# Patient Record
Sex: Female | Born: 1965 | Race: White | Hispanic: No | Marital: Married
Health system: Southern US, Community
[De-identification: ages and names within clinical notes are randomized; demographics above are authoritative.]

---

## 2010-03-07 ENCOUNTER — Encounter: Admission: RE | Admit: 2010-03-07 | Discharge: 2010-03-07 | Payer: Self-pay | Admitting: Surgery

## 2010-03-07 HISTORY — PX: BREAST BIOPSY: SHX20

## 2010-03-12 ENCOUNTER — Emergency Department (HOSPITAL_COMMUNITY): Admission: EM | Admit: 2010-03-12 | Discharge: 2010-03-12 | Payer: Self-pay | Admitting: Emergency Medicine

## 2011-02-11 LAB — CBC
Hemoglobin: 13.5 g/dL (ref 12.0–15.0)
MCHC: 34 g/dL (ref 30.0–36.0)
Platelets: 253 10*3/uL (ref 150–400)
RBC: 4.36 MIL/uL (ref 3.87–5.11)
RDW: 12.9 % (ref 11.5–15.5)

## 2011-02-11 LAB — DIFFERENTIAL
Lymphocytes Relative: 33 % (ref 12–46)
Lymphs Abs: 1.6 10*3/uL (ref 0.7–4.0)
Monocytes Relative: 5 % (ref 3–12)
Neutro Abs: 3 10*3/uL (ref 1.7–7.7)
Neutrophils Relative %: 60 % (ref 43–77)

## 2011-02-11 LAB — POCT PREGNANCY, URINE: Preg Test, Ur: NEGATIVE

## 2011-02-11 LAB — URINALYSIS, ROUTINE W REFLEX MICROSCOPIC
Nitrite: NEGATIVE
Protein, ur: NEGATIVE mg/dL
Urobilinogen, UA: 0.2 mg/dL (ref 0.0–1.0)

## 2011-02-11 LAB — BASIC METABOLIC PANEL
Calcium: 8.9 mg/dL (ref 8.4–10.5)
GFR calc Af Amer: >60 mL/min (ref >60)

## 2011-02-11 LAB — GC/CHLAMYDIA PROBE AMP, GENITAL
Chlamydia, DNA Probe: NEGATIVE
GC Probe Amp, Genital: NEGATIVE

## 2011-02-11 LAB — RPR: RPR Ser Ql: NONREACTIVE

## 2011-02-11 LAB — URINE MICROSCOPIC-ADD ON

## 2011-04-14 ENCOUNTER — Other Ambulatory Visit: Payer: Self-pay | Admitting: Obstetrics and Gynecology

## 2011-04-14 DIAGNOSIS — N6320 Unspecified lump in the left breast, unspecified quadrant: Secondary | ICD-10-CM

## 2011-04-14 DIAGNOSIS — R921 Mammographic calcification found on diagnostic imaging of breast: Secondary | ICD-10-CM

## 2011-04-17 ENCOUNTER — Other Ambulatory Visit: Payer: Self-pay | Admitting: Obstetrics and Gynecology

## 2011-04-17 ENCOUNTER — Ambulatory Visit
Admission: RE | Admit: 2011-04-17 | Discharge: 2011-04-17 | Disposition: A | Discharge status: Home / Self Care | Payer: 59 | Source: Ambulatory Visit | Attending: Obstetrics and Gynecology | Admitting: Obstetrics and Gynecology

## 2011-04-17 DIAGNOSIS — N63 Unspecified lump in unspecified breast: Secondary | ICD-10-CM

## 2011-04-17 DIAGNOSIS — N6320 Unspecified lump in the left breast, unspecified quadrant: Secondary | ICD-10-CM

## 2011-04-17 DIAGNOSIS — R921 Mammographic calcification found on diagnostic imaging of breast: Secondary | ICD-10-CM

## 2011-08-07 ENCOUNTER — Other Ambulatory Visit: Payer: Self-pay | Admitting: Obstetrics and Gynecology

## 2011-08-07 DIAGNOSIS — D249 Benign neoplasm of unspecified breast: Secondary | ICD-10-CM

## 2011-08-14 ENCOUNTER — Ambulatory Visit
Admission: RE | Admit: 2011-08-14 | Discharge: 2011-08-14 | Disposition: A | Discharge status: Home / Self Care | Payer: 59 | Source: Ambulatory Visit | Attending: Obstetrics and Gynecology | Admitting: Obstetrics and Gynecology

## 2011-08-14 ENCOUNTER — Other Ambulatory Visit: Payer: Self-pay | Admitting: Obstetrics and Gynecology

## 2011-08-14 ENCOUNTER — Ambulatory Visit: Admission: RE | Admit: 2011-08-14 | Payer: 59 | Source: Ambulatory Visit

## 2011-08-14 DIAGNOSIS — D249 Benign neoplasm of unspecified breast: Secondary | ICD-10-CM

## 2011-08-14 HISTORY — PX: BREAST BIOPSY: SHX20

## 2012-01-16 ENCOUNTER — Other Ambulatory Visit: Payer: Self-pay | Admitting: Obstetrics and Gynecology

## 2012-01-16 DIAGNOSIS — Z1231 Encounter for screening mammogram for malignant neoplasm of breast: Secondary | ICD-10-CM

## 2012-02-19 ENCOUNTER — Ambulatory Visit: Payer: 59

## 2012-02-20 ENCOUNTER — Ambulatory Visit: Payer: 59

## 2012-03-01 ENCOUNTER — Ambulatory Visit
Admission: RE | Admit: 2012-03-01 | Discharge: 2012-03-01 | Disposition: A | Discharge status: Home / Self Care | Payer: 59 | Source: Ambulatory Visit | Attending: Obstetrics and Gynecology | Admitting: Obstetrics and Gynecology

## 2012-03-01 DIAGNOSIS — Z1231 Encounter for screening mammogram for malignant neoplasm of breast: Secondary | ICD-10-CM

## 2012-03-03 ENCOUNTER — Other Ambulatory Visit: Payer: Self-pay | Admitting: Obstetrics and Gynecology

## 2012-03-03 DIAGNOSIS — R928 Other abnormal and inconclusive findings on diagnostic imaging of breast: Secondary | ICD-10-CM

## 2012-03-05 ENCOUNTER — Inpatient Hospital Stay: Admission: RE | Admit: 2012-03-05 | Payer: 59 | Source: Ambulatory Visit

## 2012-03-15 ENCOUNTER — Ambulatory Visit
Admission: RE | Admit: 2012-03-15 | Discharge: 2012-03-15 | Disposition: A | Discharge status: Home / Self Care | Payer: 59 | Source: Ambulatory Visit | Attending: Obstetrics and Gynecology | Admitting: Obstetrics and Gynecology

## 2012-03-15 DIAGNOSIS — R928 Other abnormal and inconclusive findings on diagnostic imaging of breast: Secondary | ICD-10-CM

## 2012-12-15 ENCOUNTER — Other Ambulatory Visit: Payer: Self-pay | Admitting: Obstetrics and Gynecology

## 2012-12-15 DIAGNOSIS — N6320 Unspecified lump in the left breast, unspecified quadrant: Secondary | ICD-10-CM

## 2013-02-08 ENCOUNTER — Other Ambulatory Visit: Payer: Self-pay

## 2013-02-08 DIAGNOSIS — Z1231 Encounter for screening mammogram for malignant neoplasm of breast: Secondary | ICD-10-CM

## 2013-02-14 ENCOUNTER — Other Ambulatory Visit: Payer: Self-pay

## 2013-02-14 ENCOUNTER — Ambulatory Visit
Admission: RE | Admit: 2013-02-14 | Discharge: 2013-02-14 | Disposition: A | Discharge status: Home / Self Care | Payer: BC Managed Care – PPO | Source: Ambulatory Visit

## 2013-02-14 ENCOUNTER — Ambulatory Visit
Admission: RE | Admit: 2013-02-14 | Discharge: 2013-02-14 | Disposition: A | Discharge status: Home / Self Care | Payer: BC Managed Care – PPO | Source: Ambulatory Visit | Attending: Obstetrics and Gynecology | Admitting: Obstetrics and Gynecology

## 2013-02-14 DIAGNOSIS — N6320 Unspecified lump in the left breast, unspecified quadrant: Secondary | ICD-10-CM

## 2013-02-14 DIAGNOSIS — Z1231 Encounter for screening mammogram for malignant neoplasm of breast: Secondary | ICD-10-CM

## 2013-03-17 ENCOUNTER — Ambulatory Visit: Payer: 59

## 2014-01-23 ENCOUNTER — Other Ambulatory Visit: Payer: Self-pay

## 2014-01-23 DIAGNOSIS — Z1231 Encounter for screening mammogram for malignant neoplasm of breast: Secondary | ICD-10-CM

## 2014-02-15 ENCOUNTER — Other Ambulatory Visit: Payer: Self-pay | Admitting: Obstetrics and Gynecology

## 2014-02-15 ENCOUNTER — Ambulatory Visit
Admission: RE | Admit: 2014-02-15 | Discharge: 2014-02-15 | Disposition: A | Discharge status: Home / Self Care | Payer: Self-pay | Source: Ambulatory Visit

## 2014-02-15 DIAGNOSIS — N63 Unspecified lump in unspecified breast: Secondary | ICD-10-CM

## 2014-02-15 DIAGNOSIS — Z1231 Encounter for screening mammogram for malignant neoplasm of breast: Secondary | ICD-10-CM

## 2014-03-03 ENCOUNTER — Ambulatory Visit
Admission: RE | Admit: 2014-03-03 | Discharge: 2014-03-03 | Disposition: A | Discharge status: Home / Self Care | Payer: BC Managed Care – PPO | Source: Ambulatory Visit | Attending: Obstetrics and Gynecology | Admitting: Obstetrics and Gynecology

## 2014-03-03 DIAGNOSIS — N63 Unspecified lump in unspecified breast: Secondary | ICD-10-CM

## 2015-02-23 ENCOUNTER — Other Ambulatory Visit: Payer: Self-pay

## 2015-02-23 DIAGNOSIS — Z1231 Encounter for screening mammogram for malignant neoplasm of breast: Secondary | ICD-10-CM

## 2015-03-05 ENCOUNTER — Ambulatory Visit: Payer: Self-pay

## 2015-03-15 ENCOUNTER — Ambulatory Visit
Admission: RE | Admit: 2015-03-15 | Discharge: 2015-03-15 | Disposition: A | Discharge status: Home / Self Care | Payer: No Typology Code available for payment source | Source: Ambulatory Visit

## 2015-03-15 DIAGNOSIS — Z1231 Encounter for screening mammogram for malignant neoplasm of breast: Secondary | ICD-10-CM

## 2016-01-07 ENCOUNTER — Other Ambulatory Visit: Payer: Self-pay

## 2016-01-07 DIAGNOSIS — Z1231 Encounter for screening mammogram for malignant neoplasm of breast: Secondary | ICD-10-CM

## 2016-03-17 ENCOUNTER — Ambulatory Visit
Admission: RE | Admit: 2016-03-17 | Discharge: 2016-03-17 | Disposition: A | Discharge status: Home / Self Care | Payer: No Typology Code available for payment source | Source: Ambulatory Visit

## 2016-03-17 DIAGNOSIS — Z1231 Encounter for screening mammogram for malignant neoplasm of breast: Secondary | ICD-10-CM

## 2017-04-08 ENCOUNTER — Other Ambulatory Visit: Payer: Self-pay | Admitting: Obstetrics and Gynecology

## 2017-04-08 DIAGNOSIS — Z1231 Encounter for screening mammogram for malignant neoplasm of breast: Secondary | ICD-10-CM

## 2017-04-16 ENCOUNTER — Ambulatory Visit: Payer: No Typology Code available for payment source

## 2017-04-30 ENCOUNTER — Ambulatory Visit
Admission: RE | Admit: 2017-04-30 | Discharge: 2017-04-30 | Disposition: A | Discharge status: Home / Self Care | Payer: No Typology Code available for payment source | Source: Ambulatory Visit | Attending: Obstetrics and Gynecology | Admitting: Obstetrics and Gynecology

## 2017-04-30 DIAGNOSIS — Z1231 Encounter for screening mammogram for malignant neoplasm of breast: Secondary | ICD-10-CM

## 2019-01-10 ENCOUNTER — Other Ambulatory Visit: Payer: Self-pay | Admitting: Obstetrics and Gynecology

## 2019-01-10 DIAGNOSIS — Z1231 Encounter for screening mammogram for malignant neoplasm of breast: Secondary | ICD-10-CM

## 2019-01-11 ENCOUNTER — Ambulatory Visit
Admission: RE | Admit: 2019-01-11 | Discharge: 2019-01-11 | Disposition: A | Discharge status: Home / Self Care | Payer: No Typology Code available for payment source | Source: Ambulatory Visit | Attending: Obstetrics and Gynecology | Admitting: Obstetrics and Gynecology

## 2019-01-11 DIAGNOSIS — Z1231 Encounter for screening mammogram for malignant neoplasm of breast: Secondary | ICD-10-CM

## 2020-03-19 IMAGING — MG DIGITAL SCREENING BILATERAL MAMMOGRAM WITH TOMO AND CAD
8 series · 8 of 24 positions shown · non-contrast
Comparison: Previous exam(s).

CLINICAL DATA: Screening.

EXAM:
DIGITAL SCREENING BILATERAL MAMMOGRAM WITH TOMO AND CAD

[L MLO synth-2D]
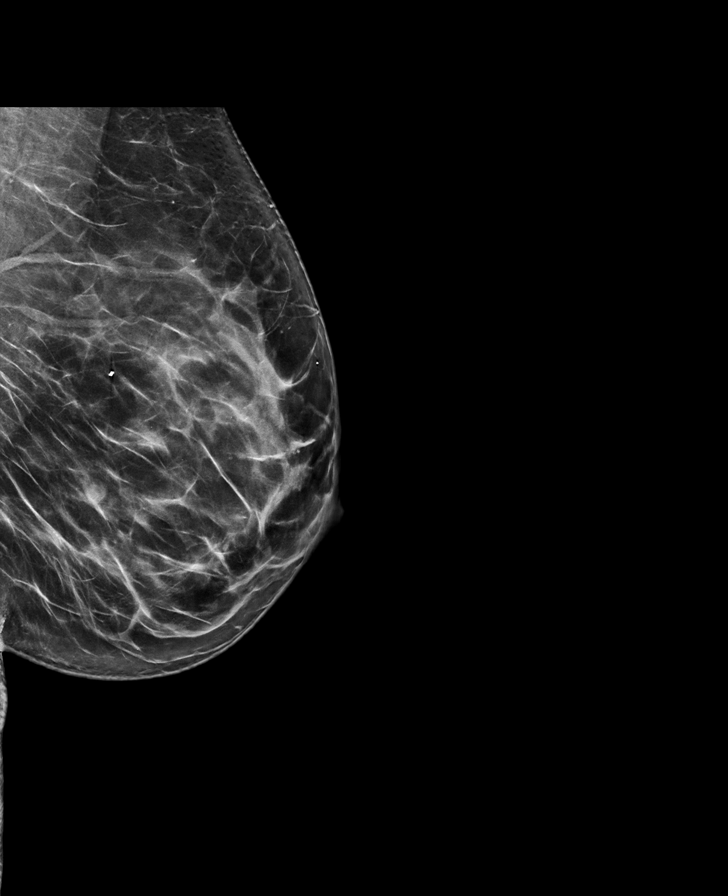

[R MLO synth-2D]
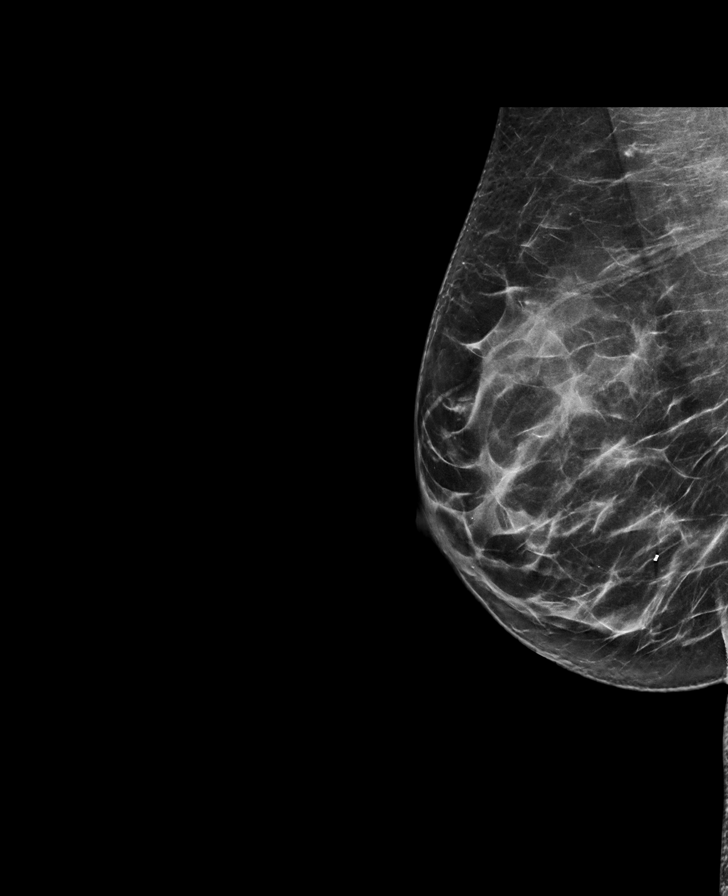

[L CC synth-2D]
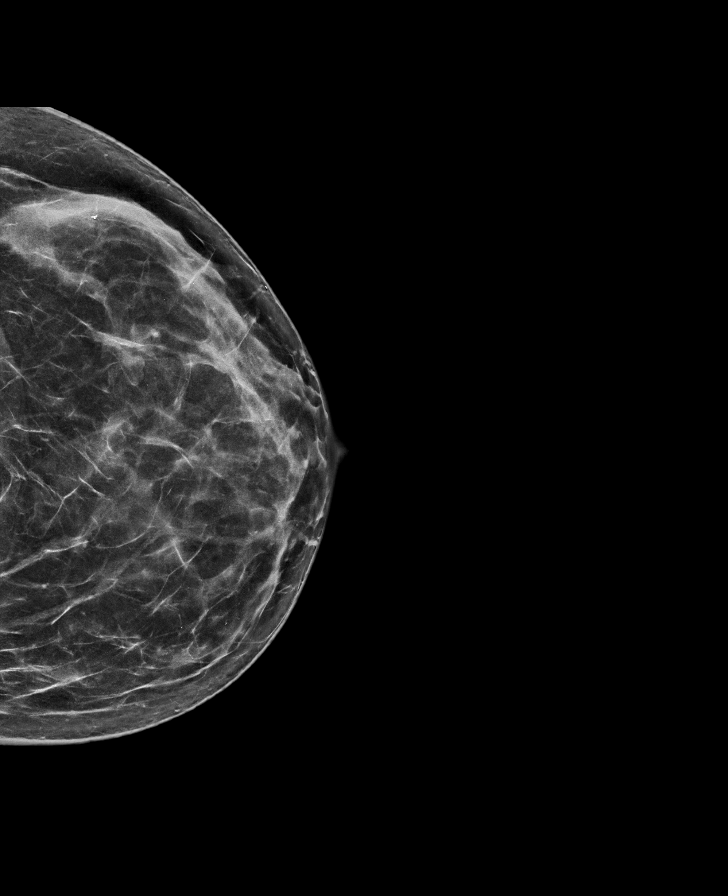

[R CC synth-2D]
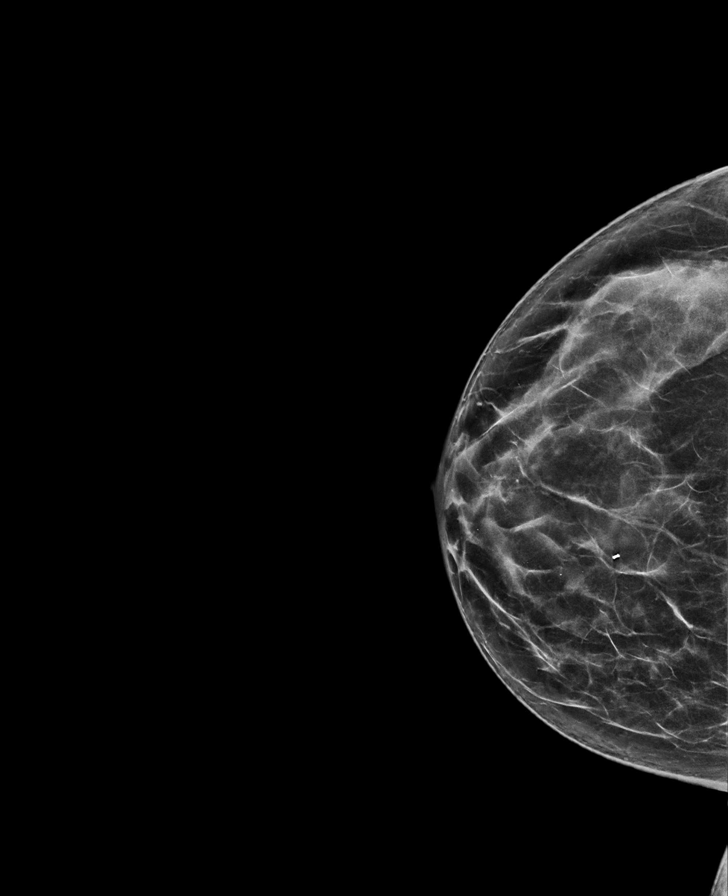

[R MLO tomo · tomo slice 37/72.0]
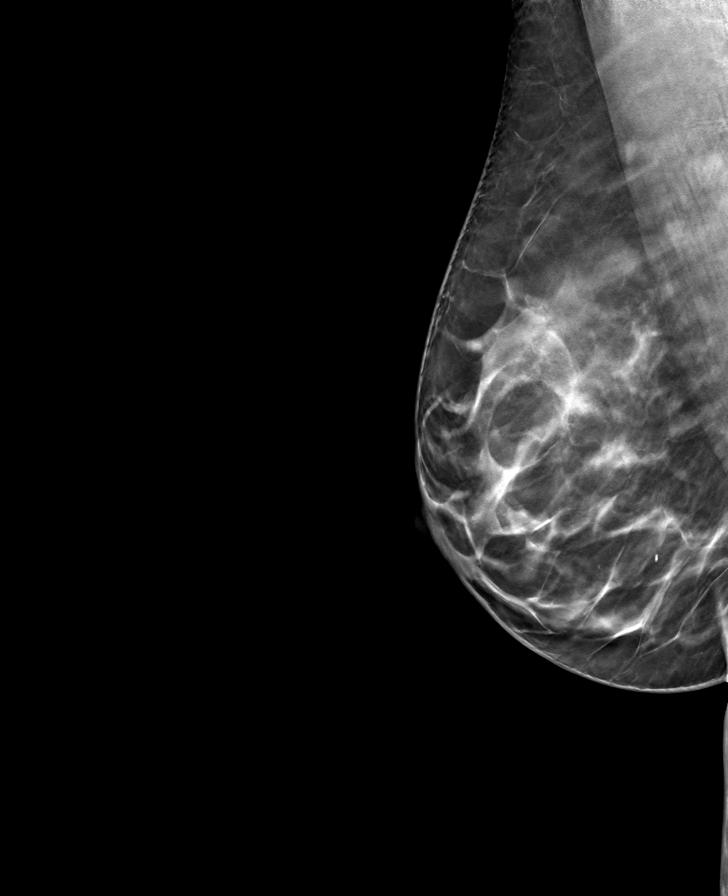

[L CC tomo · tomo slice 35/69.0]
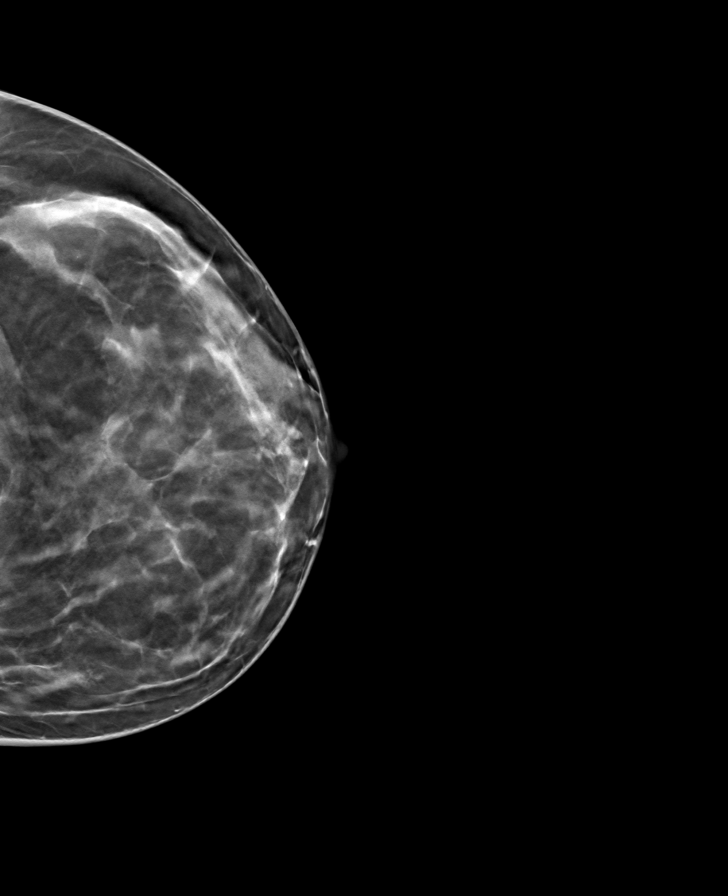

[L MLO tomo · tomo slice 39/78.0]
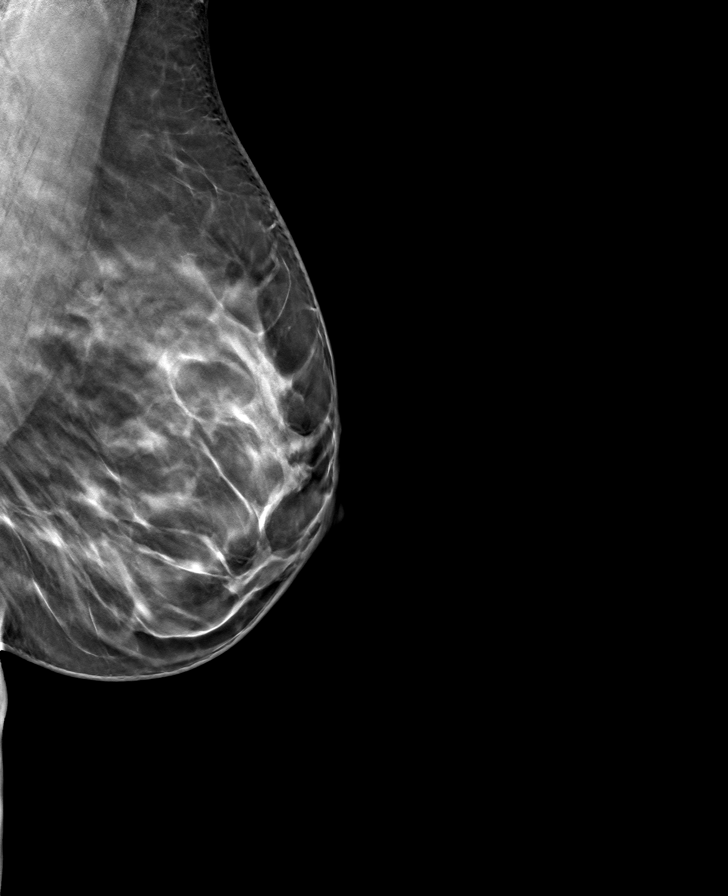

[R CC tomo · tomo slice 35/68.0]
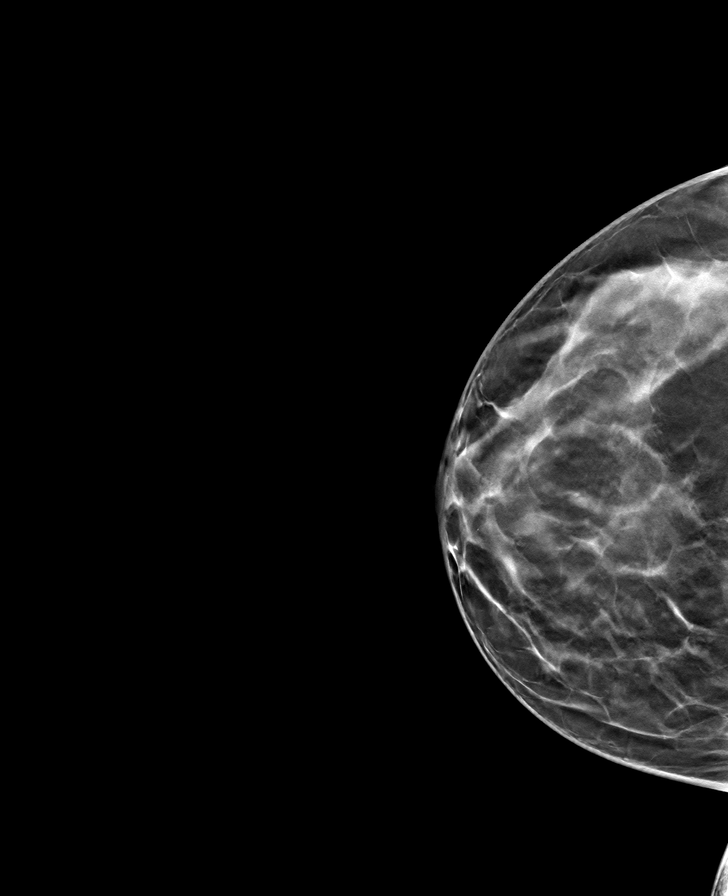

[8 of 24 positions shown; findings below may reference images not displayed]

ACR Breast Density Category c: The breast tissue is heterogeneously
dense, which may obscure small masses.
FINDINGS: There are no findings suspicious for malignancy. Images were
processed with CAD.
IMPRESSION: No mammographic evidence of malignancy. A result letter of this
screening mammogram will be mailed directly to the patient.

RECOMMENDATION:
Screening mammogram in one year. (Code:[id number])

BI-RADS CATEGORY  1: Negative.

## 2021-07-23 ENCOUNTER — Other Ambulatory Visit: Payer: Self-pay | Admitting: Obstetrics and Gynecology

## 2021-07-23 DIAGNOSIS — Z1231 Encounter for screening mammogram for malignant neoplasm of breast: Secondary | ICD-10-CM

## 2021-08-29 ENCOUNTER — Ambulatory Visit
Admission: RE | Admit: 2021-08-29 | Discharge: 2021-08-29 | Disposition: A | Discharge status: Home / Self Care | Payer: No Typology Code available for payment source | Source: Ambulatory Visit | Attending: Obstetrics and Gynecology | Admitting: Obstetrics and Gynecology

## 2021-08-29 ENCOUNTER — Other Ambulatory Visit: Payer: Self-pay

## 2021-08-29 DIAGNOSIS — Z1231 Encounter for screening mammogram for malignant neoplasm of breast: Secondary | ICD-10-CM

## 2022-03-05 ENCOUNTER — Ambulatory Visit: Payer: No Typology Code available for payment source | Admitting: Internal Medicine

## 2022-04-24 ENCOUNTER — Other Ambulatory Visit: Payer: Self-pay | Admitting: Obstetrics and Gynecology

## 2022-04-24 DIAGNOSIS — Z1231 Encounter for screening mammogram for malignant neoplasm of breast: Secondary | ICD-10-CM

## 2022-09-01 ENCOUNTER — Ambulatory Visit: Payer: No Typology Code available for payment source

## 2022-10-01 ENCOUNTER — Ambulatory Visit
Admission: RE | Admit: 2022-10-01 | Discharge: 2022-10-01 | Disposition: A | Discharge status: Home / Self Care | Payer: No Typology Code available for payment source | Source: Ambulatory Visit | Attending: Obstetrics and Gynecology | Admitting: Obstetrics and Gynecology

## 2022-10-01 DIAGNOSIS — Z1231 Encounter for screening mammogram for malignant neoplasm of breast: Secondary | ICD-10-CM
